# Patient Record
Sex: Female | Born: 1960 | Race: White | Hispanic: No | Marital: Single | Smoking: Current every day smoker
Health system: Southern US, Community
[De-identification: ages and names within clinical notes are randomized; demographics above are authoritative.]

## PROBLEM LIST (undated history)

## (undated) DIAGNOSIS — I10 Essential (primary) hypertension: Secondary | ICD-10-CM

## (undated) DIAGNOSIS — M549 Dorsalgia, unspecified: Secondary | ICD-10-CM

## (undated) DIAGNOSIS — E7800 Pure hypercholesterolemia, unspecified: Secondary | ICD-10-CM

## (undated) DIAGNOSIS — E039 Hypothyroidism, unspecified: Secondary | ICD-10-CM

## (undated) DIAGNOSIS — E119 Type 2 diabetes mellitus without complications: Secondary | ICD-10-CM

## (undated) HISTORY — PX: HERNIA REPAIR: SHX51

## (undated) HISTORY — PX: ECTOPIC PREGNANCY SURGERY: SHX613

## (undated) HISTORY — PX: CHOLECYSTECTOMY: SHX55

---

## 2014-01-17 ENCOUNTER — Encounter (HOSPITAL_BASED_OUTPATIENT_CLINIC_OR_DEPARTMENT_OTHER): Payer: Self-pay | Admitting: Emergency Medicine

## 2014-01-17 ENCOUNTER — Emergency Department (HOSPITAL_BASED_OUTPATIENT_CLINIC_OR_DEPARTMENT_OTHER)
Admission: EM | Admit: 2014-01-17 | Discharge: 2014-01-17 | Disposition: A | Discharge status: Home / Self Care | Payer: Self-pay | Attending: Emergency Medicine | Admitting: Emergency Medicine

## 2014-01-17 ENCOUNTER — Emergency Department (HOSPITAL_BASED_OUTPATIENT_CLINIC_OR_DEPARTMENT_OTHER): Payer: Self-pay

## 2014-01-17 DIAGNOSIS — E039 Hypothyroidism, unspecified: Secondary | ICD-10-CM | POA: Insufficient documentation

## 2014-01-17 DIAGNOSIS — R5381 Other malaise: Secondary | ICD-10-CM | POA: Insufficient documentation

## 2014-01-17 DIAGNOSIS — R5383 Other fatigue: Secondary | ICD-10-CM

## 2014-01-17 DIAGNOSIS — I10 Essential (primary) hypertension: Secondary | ICD-10-CM | POA: Insufficient documentation

## 2014-01-17 DIAGNOSIS — E119 Type 2 diabetes mellitus without complications: Secondary | ICD-10-CM | POA: Insufficient documentation

## 2014-01-17 DIAGNOSIS — Z79899 Other long term (current) drug therapy: Secondary | ICD-10-CM | POA: Insufficient documentation

## 2014-01-17 DIAGNOSIS — F17200 Nicotine dependence, unspecified, uncomplicated: Secondary | ICD-10-CM | POA: Insufficient documentation

## 2014-01-17 HISTORY — DX: Type 2 diabetes mellitus without complications: E11.9

## 2014-01-17 HISTORY — DX: Essential (primary) hypertension: I10

## 2014-01-17 HISTORY — DX: Pure hypercholesterolemia, unspecified: E78.00

## 2014-01-17 HISTORY — DX: Hypothyroidism, unspecified: E03.9

## 2014-01-17 HISTORY — DX: Dorsalgia, unspecified: M54.9

## 2014-01-17 LAB — CBC WITH DIFFERENTIAL/PLATELET
BASOS PCT: 0 % (ref 0–1)
Basophils Absolute: 0 10*3/uL (ref 0.0–0.1)
EOS ABS: 0.4 10*3/uL (ref 0.0–0.7)
Eosinophils Relative: 13 % — ABNORMAL HIGH (ref 0–5)
HCT: 35 % — ABNORMAL LOW (ref 36.0–46.0)
HEMOGLOBIN: 12.4 g/dL (ref 12.0–15.0)
Lymphocytes Relative: 27 % (ref 12–46)
Lymphs Abs: 0.9 10*3/uL (ref 0.7–4.0)
MCH: 31.6 pg (ref 26.0–34.0)
MCHC: 35.4 g/dL (ref 30.0–36.0)
MCV: 89.3 fL (ref 78.0–100.0)
MONO ABS: 0.4 10*3/uL (ref 0.1–1.0)
Monocytes Relative: 11 % (ref 3–12)
NEUTROS PCT: 49 % (ref 43–77)
Neutro Abs: 1.6 10*3/uL — ABNORMAL LOW (ref 1.7–7.7)
Platelets: 129 10*3/uL — ABNORMAL LOW (ref 150–400)
RBC: 3.92 MIL/uL (ref 3.87–5.11)
RDW: 13.2 % (ref 11.5–15.5)
WBC: 3.4 10*3/uL — AB (ref 4.0–10.5)

## 2014-01-17 LAB — COMPREHENSIVE METABOLIC PANEL
ALBUMIN: 3.8 g/dL (ref 3.5–5.2)
ALT: 25 U/L (ref 0–35)
AST: 20 U/L (ref 0–37)
Alkaline Phosphatase: 81 U/L (ref 39–117)
BUN: 12 mg/dL (ref 6–23)
CO2: 27 meq/L (ref 19–32)
CREATININE: 0.7 mg/dL (ref 0.50–1.10)
Calcium: 9.9 mg/dL (ref 8.4–10.5)
Chloride: 102 mEq/L (ref 96–112)
GFR calc Af Amer: >90 mL/min (ref >90)
Glucose, Bld: 139 mg/dL — ABNORMAL HIGH (ref 70–99)
Potassium: 3.6 mEq/L — ABNORMAL LOW (ref 3.7–5.3)
Sodium: 141 mEq/L (ref 137–147)
Total Bilirubin: 0.5 mg/dL (ref 0.3–1.2)
Total Protein: 7.4 g/dL (ref 6.0–8.3)

## 2014-01-17 LAB — URINALYSIS, ROUTINE W REFLEX MICROSCOPIC
Bilirubin Urine: NEGATIVE
Glucose, UA: 500 mg/dL — AB
Hgb urine dipstick: NEGATIVE
Ketones, ur: NEGATIVE mg/dL
LEUKOCYTES UA: NEGATIVE
NITRITE: NEGATIVE
Protein, ur: NEGATIVE mg/dL
SPECIFIC GRAVITY, URINE: 1.008 (ref 1.005–1.030)
Urobilinogen, UA: 0.2 mg/dL (ref 0.0–1.0)
pH: 5.5 (ref 5.0–8.0)

## 2014-01-17 LAB — PROTIME-INR
INR: 1.05 (ref 0.00–1.49)
Prothrombin Time: 13.5 seconds (ref 11.6–15.2)

## 2014-01-17 LAB — TROPONIN I

## 2014-01-17 NOTE — ED Notes (Addendum)
C/o general weakness for past 9 days. Denies any cold/congestion/fevers. States hx of chronic back pain that is bothering her as well. States she has had a decrease in her appetite. Has been drinking fluids. Denies any cp/sob.  Pt states she started on thyroid med for hypothyroid in Jan.

## 2014-01-17 NOTE — ED Notes (Signed)
Patient transported to CT 

## 2014-01-17 NOTE — Discharge Instructions (Signed)

## 2014-01-17 NOTE — ED Provider Notes (Signed)
CSN: [id number]     Arrival date & time 01/17/14  0112 History   First MD Initiated Contact with Patient 01/17/14 [phone]     Chief Complaint  Patient presents with  . Weakness     (Consider location/radiation/quality/duration/timing/severity/associated sxs/prior Treatment) Patient is a 53 y.o. female presenting with weakness. The history is provided by the patient.  Weakness This is a new problem. The current episode started more than 1 week ago. The problem occurs constantly. The problem has not changed since onset.Pertinent negatives include no chest pain, no abdominal pain, no headaches and no shortness of breath. Nothing aggravates the symptoms. Nothing relieves the symptoms. She has tried nothing for the symptoms. The treatment provided no relief.  Symptoms of fatigue.  No n/v/d.  No neuro deficits, no changes in vision speech no focal weakness. No HA.  Bo CP no SOB no DOE  Past Medical History  Diagnosis Date  . Back pain   . Diabetes mellitus without complication   . Hypothyroid   . High cholesterol   . Hypertension    Past Surgical History  Procedure Laterality Date  . Ectopic pregnancy surgery    . Cholecystectomy    . Hernia repair     No family history on file. History  Substance Use Topics  . Smoking status: Current Every Day Smoker  . Smokeless tobacco: Not on file  . Alcohol Use: Not on file   OB History   Grav Para Term Preterm Abortions TAB SAB Ect Mult Living                 Review of Systems  Constitutional: Positive for fatigue. Negative for fever.  HENT: Negative for congestion.   Respiratory: Negative for cough, chest tightness and shortness of breath.   Cardiovascular: Negative for chest pain, palpitations and leg swelling.  Gastrointestinal: Negative for nausea, vomiting, abdominal pain and diarrhea.  Genitourinary: Negative for dysuria.  Musculoskeletal: Negative for neck pain and neck stiffness.  Skin: Negative for rash.  Neurological: Negative  for dizziness, syncope, facial asymmetry, speech difficulty, weakness, numbness and headaches.  All other systems reviewed and are negative.      Allergies  Review of patient's allergies indicates no known allergies.  Home Medications   Current Outpatient Rx  Name  Route  Sig  Dispense  Refill  . cyclobenzaprine (FLEXERIL) 10 MG tablet   Oral   Take 10 mg by mouth 3 (three) times daily as needed for muscle spasms.         . hydrochlorothiazide (HYDRODIURIL) 25 MG tablet   Oral   Take 25 mg by mouth daily.         Marland Kitchen. levothyroxine (SYNTHROID, LEVOTHROID) 25 MCG tablet   Oral   Take 25 mcg by mouth daily before breakfast.         . lisinopril (PRINIVIL,ZESTRIL) 10 MG tablet   Oral   Take 10 mg by mouth daily.         . meloxicam (MOBIC) 15 MG tablet   Oral   Take 15 mg by mouth as needed for pain.         . metFORMIN (GLUMETZA) 500 MG (MOD) 24 hr tablet   Oral   Take 500 mg by mouth 3 (three) times daily.          BP 143/76  Pulse 80  Temp(Src) 97.9 F (36.6 C) (Oral)  Resp 23  Ht 5\' 8"  (1.727 m)  Wt 200 lb (90.719 kg)  BMI 30.42 kg/m2  SpO2 100% Physical Exam  Constitutional: She is oriented to person, place, and time. She appears well-developed and well-nourished. No distress.  HENT:  Head: Normocephalic and atraumatic.  Mouth/Throat: Oropharynx is clear and moist.  Eyes: Conjunctivae and EOM are normal. Pupils are equal, round, and reactive to light.  Neck: Neck supple.  Cardiovascular: Normal rate, regular rhythm and intact distal pulses.   Pulmonary/Chest: Effort normal and breath sounds normal. She has no wheezes. She has no rales.  Abdominal: Soft. Bowel sounds are normal. There is no tenderness. There is no rebound and no guarding.  Musculoskeletal: Normal range of motion. She exhibits no edema and no tenderness.  Neurological: She is alert and oriented to person, place, and time. She has normal reflexes. She displays normal reflexes. No  cranial nerve deficit.  Skin: Skin is warm and dry.  Psychiatric: She has a normal mood and affect.    ED Course  Procedures (including critical care time) Labs Review Labs Reviewed  URINALYSIS, ROUTINE W REFLEX MICROSCOPIC - Abnormal; Notable for the following:    Glucose, UA 500 (*)    All other components within normal limits  CBC WITH DIFFERENTIAL - Abnormal; Notable for the following:    WBC 3.4 (*)    HCT 35.0 (*)    Platelets 129 (*)    Neutro Abs 1.6 (*)    Eosinophils Relative 13 (*)    All other components within normal limits  COMPREHENSIVE METABOLIC PANEL - Abnormal; Notable for the following:    Potassium 3.6 (*)    Glucose, Bld 139 (*)    All other components within normal limits  PROTIME-INR  TROPONIN I   Imaging Review Dg Chest 2 View  01/17/2014   CLINICAL DATA:  Generalized weakness for 9 days.  EXAM: CHEST  2 VIEW  COMPARISON:  No comparisons  FINDINGS: The cardiac silhouette is upper limits of normal in size, mediastinal silhouette is nonsuspicious. Mild chronic interstitial changes with increased lung volumes. No pleural effusions or focal consolidations. Linear density right lung base. No pneumothorax.  Moderate degenerative change of thoracic spine. Soft tissue planes are nonsuspicious.  IMPRESSION: Borderline cardiomegaly and apparent COPD with right lung base atelectasis versus scarring.   Electronically Signed   By: Awilda Metro   On: 01/17/2014 03:33     EKG Interpretation   Date/Time:  Monday January 17 2014 01:28:59 EDT Ventricular Rate:  68 PR Interval:  142 QRS Duration: 84 QT Interval:  386 QTC Calculation: 410 R Axis:   30 Text Interpretation:  Normal sinus rhythm Confirmed by [facility]  MD,  Priscilla Kirstein ([id number]) on 01/17/2014 3:08:45 AM      MDM   Final diagnoses:  None    Suspect this is a combination of thyroid dysfunction and patient is fatigued from taking care of her elderly enfirmed father.  No life threatening conditions.  Will  discharge with close follow up.  Is scheduled Thursday for thyroid medication adjustment    Damiya Sandefur K Nafisah Runions-Rasch, MD 01/17/14 [phone]

## 2014-01-17 NOTE — ED Notes (Signed)
Pt reports she was started on synthroid in November and had recent lab work done to check levels- states she will get results at doctor appt on thursday

## 2014-08-15 IMAGING — CR DG CHEST 2V
2 series · 2 of 2 positions shown · non-contrast
Comparison: No comparisons

CLINICAL DATA: Generalized weakness for 9 days.

EXAM:
CHEST  2 VIEW

[w chest pa]
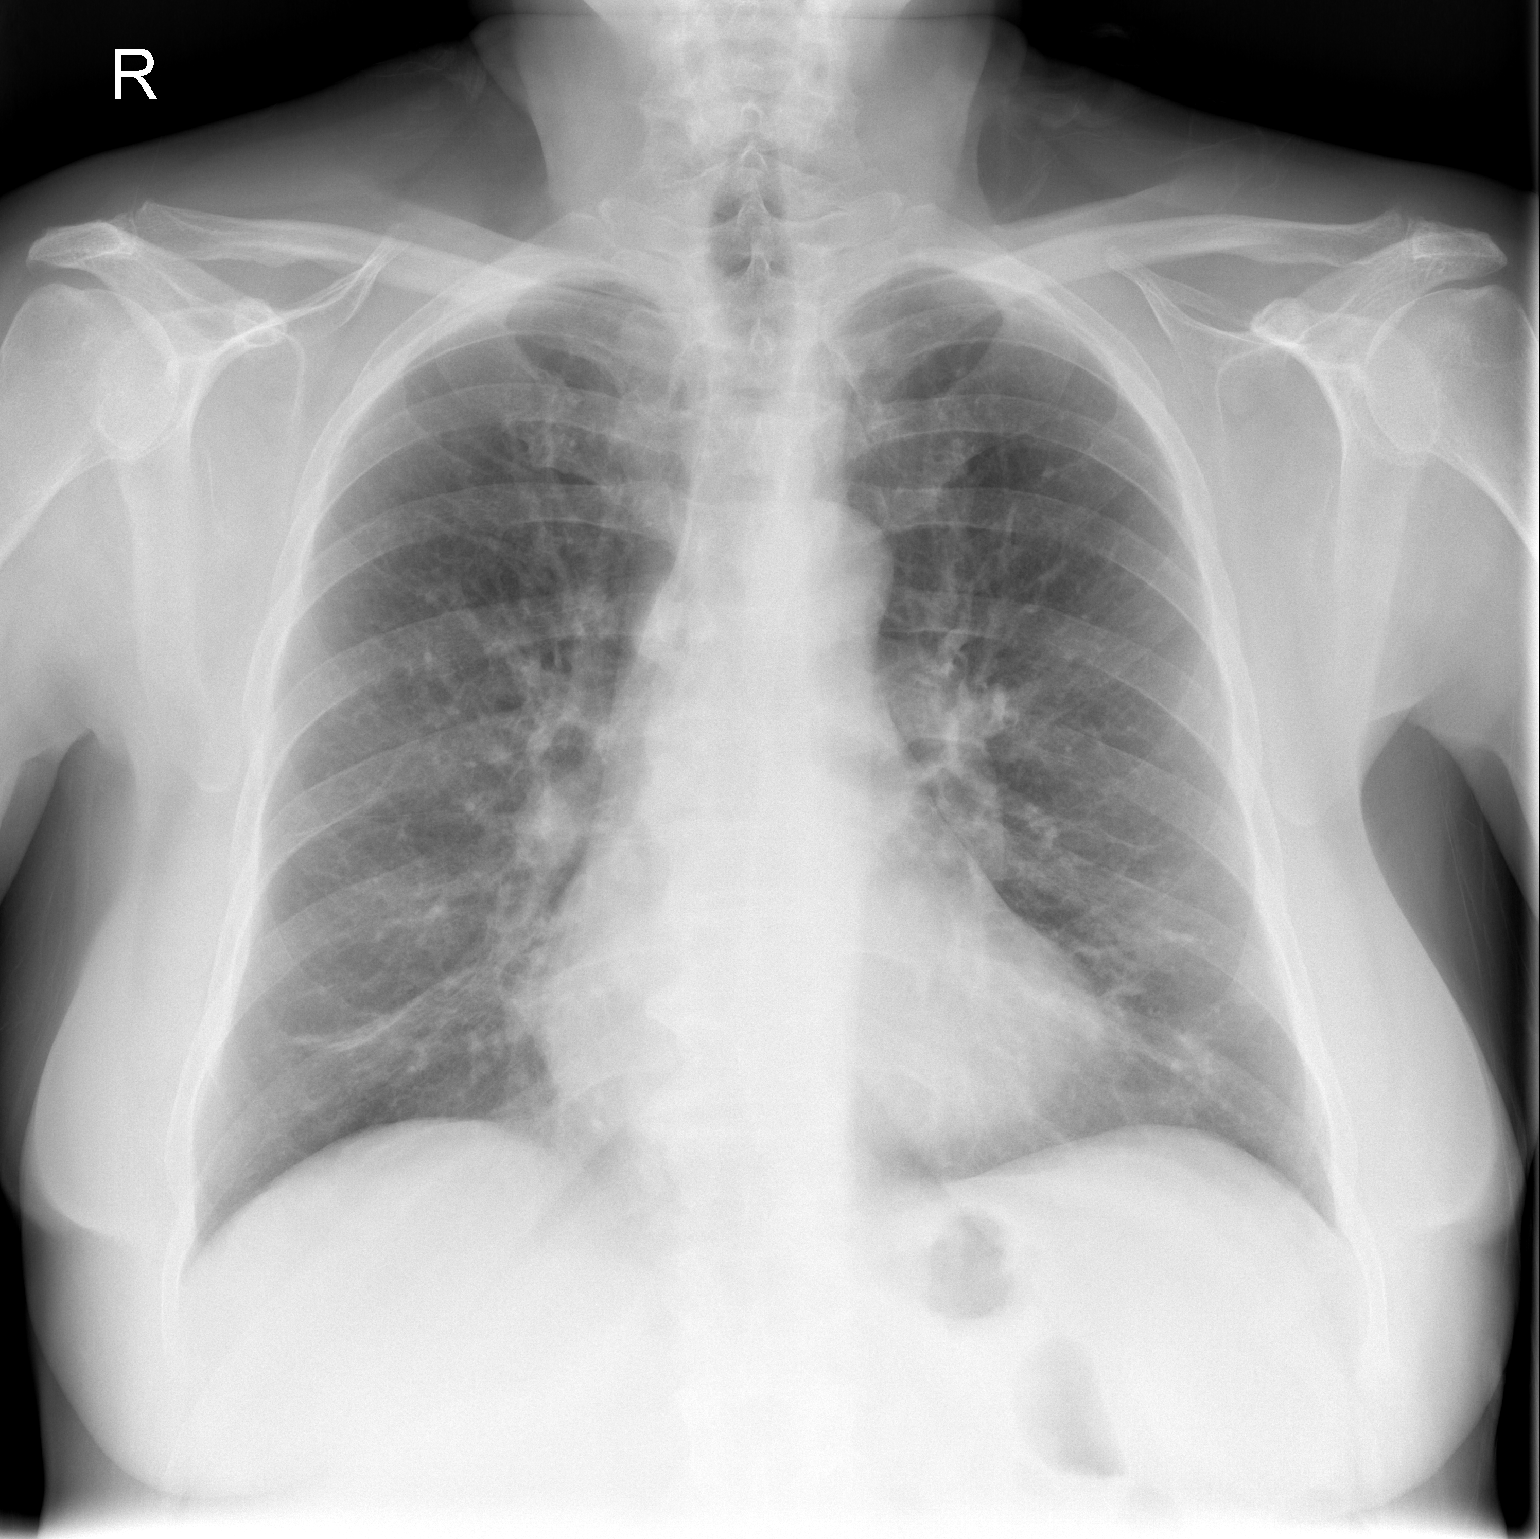

[w chest lat]
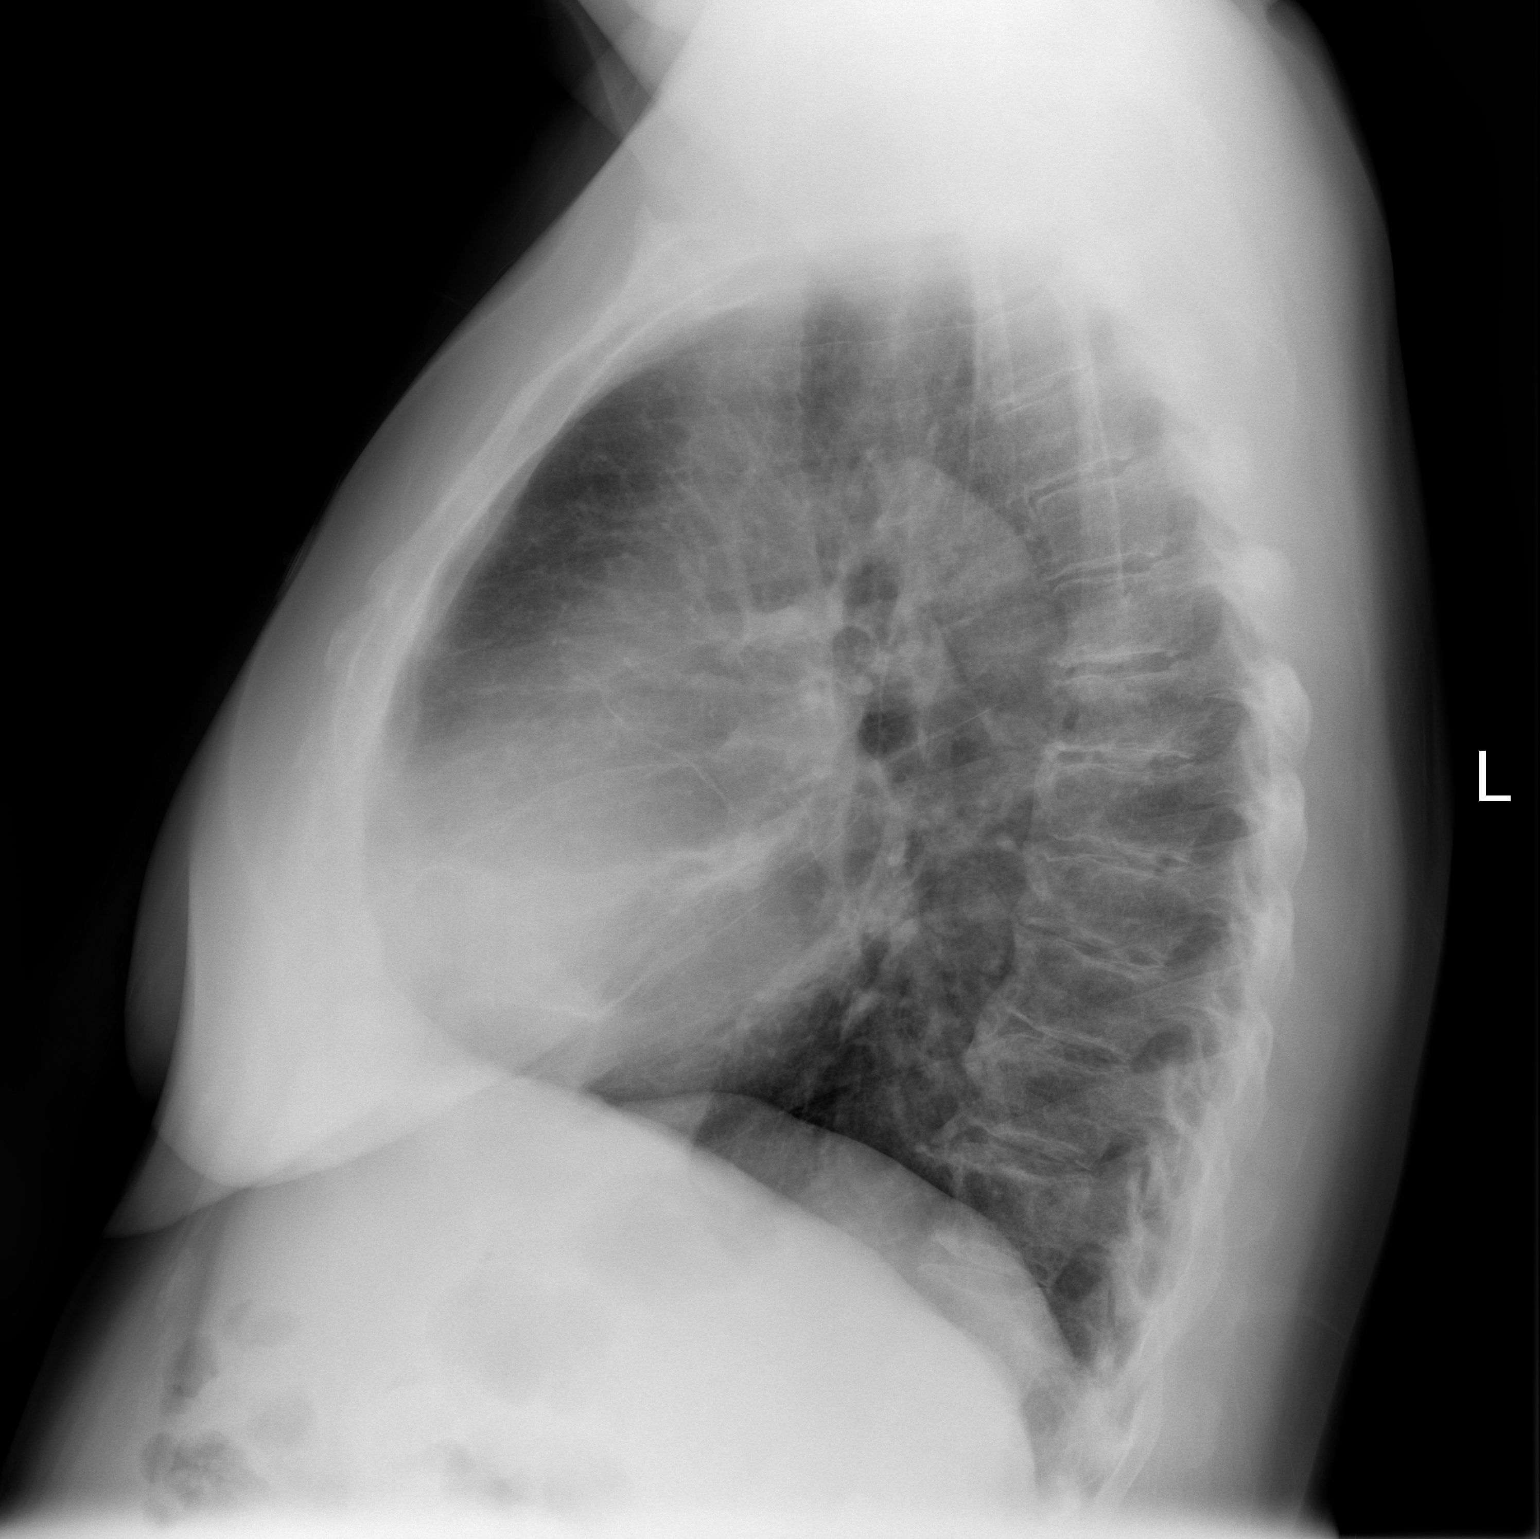

[2 of 2 positions shown; findings below may reference images not displayed]

FINDINGS: The cardiac silhouette is upper limits of normal in size,
mediastinal silhouette is nonsuspicious. Mild chronic interstitial
changes with increased lung volumes. No pleural effusions or focal
consolidations. Linear density right lung base. No pneumothorax.

Moderate degenerative change of thoracic spine. Soft tissue planes
are nonsuspicious.
IMPRESSION: Borderline cardiomegaly and apparent COPD with right lung base
atelectasis versus scarring.

  By: Blade Aujla
